# Patient Record
Sex: Male | Born: 1950
Health system: Southern US, Community
[De-identification: ages and names within clinical notes are randomized; demographics above are authoritative.]

## PROBLEM LIST (undated history)

## (undated) DIAGNOSIS — E119 Type 2 diabetes mellitus without complications: Secondary | ICD-10-CM

---

## 2016-02-13 DIAGNOSIS — E119 Type 2 diabetes mellitus without complications: Secondary | ICD-10-CM | POA: Diagnosis not present

## 2016-05-14 DIAGNOSIS — E1165 Type 2 diabetes mellitus with hyperglycemia: Secondary | ICD-10-CM | POA: Diagnosis not present

## 2016-05-14 DIAGNOSIS — Z7984 Long term (current) use of oral hypoglycemic drugs: Secondary | ICD-10-CM | POA: Diagnosis not present

## 2016-08-13 DIAGNOSIS — E119 Type 2 diabetes mellitus without complications: Secondary | ICD-10-CM | POA: Diagnosis not present

## 2016-08-13 DIAGNOSIS — I10 Essential (primary) hypertension: Secondary | ICD-10-CM | POA: Diagnosis not present

## 2016-11-12 DIAGNOSIS — I10 Essential (primary) hypertension: Secondary | ICD-10-CM | POA: Diagnosis not present

## 2016-11-12 DIAGNOSIS — Z6841 Body Mass Index (BMI) 40.0 and over, adult: Secondary | ICD-10-CM | POA: Diagnosis not present

## 2016-11-12 DIAGNOSIS — Z1159 Encounter for screening for other viral diseases: Secondary | ICD-10-CM | POA: Diagnosis not present

## 2016-11-12 DIAGNOSIS — E782 Mixed hyperlipidemia: Secondary | ICD-10-CM | POA: Diagnosis not present

## 2016-11-12 DIAGNOSIS — Z0000 Encounter for general adult medical examination without abnormal findings: Secondary | ICD-10-CM | POA: Diagnosis not present

## 2016-11-12 DIAGNOSIS — E119 Type 2 diabetes mellitus without complications: Secondary | ICD-10-CM | POA: Diagnosis not present

## 2017-05-20 DIAGNOSIS — E119 Type 2 diabetes mellitus without complications: Secondary | ICD-10-CM | POA: Diagnosis not present

## 2017-05-20 DIAGNOSIS — I10 Essential (primary) hypertension: Secondary | ICD-10-CM | POA: Diagnosis not present

## 2017-05-20 DIAGNOSIS — E782 Mixed hyperlipidemia: Secondary | ICD-10-CM | POA: Diagnosis not present

## 2017-05-20 DIAGNOSIS — Z7984 Long term (current) use of oral hypoglycemic drugs: Secondary | ICD-10-CM | POA: Diagnosis not present

## 2017-08-19 DIAGNOSIS — I10 Essential (primary) hypertension: Secondary | ICD-10-CM | POA: Diagnosis not present

## 2017-08-19 DIAGNOSIS — E782 Mixed hyperlipidemia: Secondary | ICD-10-CM | POA: Diagnosis not present

## 2017-08-19 DIAGNOSIS — E871 Hypo-osmolality and hyponatremia: Secondary | ICD-10-CM | POA: Diagnosis not present

## 2017-08-19 DIAGNOSIS — Z2821 Immunization not carried out because of patient refusal: Secondary | ICD-10-CM | POA: Diagnosis not present

## 2017-08-19 DIAGNOSIS — Z6841 Body Mass Index (BMI) 40.0 and over, adult: Secondary | ICD-10-CM | POA: Diagnosis not present

## 2017-08-19 DIAGNOSIS — E119 Type 2 diabetes mellitus without complications: Secondary | ICD-10-CM | POA: Diagnosis not present

## 2017-08-19 DIAGNOSIS — M542 Cervicalgia: Secondary | ICD-10-CM | POA: Diagnosis not present

## 2017-10-21 DIAGNOSIS — J069 Acute upper respiratory infection, unspecified: Secondary | ICD-10-CM | POA: Diagnosis not present

## 2017-10-21 DIAGNOSIS — M545 Low back pain: Secondary | ICD-10-CM | POA: Diagnosis not present

## 2017-11-18 DIAGNOSIS — Z7189 Other specified counseling: Secondary | ICD-10-CM | POA: Diagnosis not present

## 2017-11-18 DIAGNOSIS — Z6841 Body Mass Index (BMI) 40.0 and over, adult: Secondary | ICD-10-CM | POA: Diagnosis not present

## 2017-11-18 DIAGNOSIS — E119 Type 2 diabetes mellitus without complications: Secondary | ICD-10-CM | POA: Diagnosis not present

## 2017-11-18 DIAGNOSIS — E782 Mixed hyperlipidemia: Secondary | ICD-10-CM | POA: Diagnosis not present

## 2017-11-18 DIAGNOSIS — Z0000 Encounter for general adult medical examination without abnormal findings: Secondary | ICD-10-CM | POA: Diagnosis not present

## 2017-11-18 DIAGNOSIS — Z7984 Long term (current) use of oral hypoglycemic drugs: Secondary | ICD-10-CM | POA: Diagnosis not present

## 2017-11-18 DIAGNOSIS — Z1211 Encounter for screening for malignant neoplasm of colon: Secondary | ICD-10-CM | POA: Diagnosis not present

## 2017-11-18 DIAGNOSIS — I10 Essential (primary) hypertension: Secondary | ICD-10-CM | POA: Diagnosis not present

## 2017-11-18 DIAGNOSIS — Z125 Encounter for screening for malignant neoplasm of prostate: Secondary | ICD-10-CM | POA: Diagnosis not present

## 2018-01-20 DIAGNOSIS — Z7984 Long term (current) use of oral hypoglycemic drugs: Secondary | ICD-10-CM | POA: Diagnosis not present

## 2018-01-20 DIAGNOSIS — E119 Type 2 diabetes mellitus without complications: Secondary | ICD-10-CM | POA: Diagnosis not present

## 2018-01-20 DIAGNOSIS — H2513 Age-related nuclear cataract, bilateral: Secondary | ICD-10-CM | POA: Diagnosis not present

## 2018-02-24 DIAGNOSIS — N182 Chronic kidney disease, stage 2 (mild): Secondary | ICD-10-CM | POA: Diagnosis not present

## 2018-02-24 DIAGNOSIS — R809 Proteinuria, unspecified: Secondary | ICD-10-CM | POA: Diagnosis not present

## 2018-02-24 DIAGNOSIS — Z1211 Encounter for screening for malignant neoplasm of colon: Secondary | ICD-10-CM | POA: Diagnosis not present

## 2018-02-24 DIAGNOSIS — Z6841 Body Mass Index (BMI) 40.0 and over, adult: Secondary | ICD-10-CM | POA: Diagnosis not present

## 2018-02-24 DIAGNOSIS — Z7984 Long term (current) use of oral hypoglycemic drugs: Secondary | ICD-10-CM | POA: Diagnosis not present

## 2018-02-24 DIAGNOSIS — E782 Mixed hyperlipidemia: Secondary | ICD-10-CM | POA: Diagnosis not present

## 2018-02-24 DIAGNOSIS — E1129 Type 2 diabetes mellitus with other diabetic kidney complication: Secondary | ICD-10-CM | POA: Diagnosis not present

## 2018-05-26 DIAGNOSIS — E119 Type 2 diabetes mellitus without complications: Secondary | ICD-10-CM | POA: Diagnosis not present

## 2018-05-26 DIAGNOSIS — I10 Essential (primary) hypertension: Secondary | ICD-10-CM | POA: Diagnosis not present

## 2018-05-26 DIAGNOSIS — Z7984 Long term (current) use of oral hypoglycemic drugs: Secondary | ICD-10-CM | POA: Diagnosis not present

## 2018-05-26 DIAGNOSIS — E782 Mixed hyperlipidemia: Secondary | ICD-10-CM | POA: Diagnosis not present

## 2018-09-29 DIAGNOSIS — E119 Type 2 diabetes mellitus without complications: Secondary | ICD-10-CM | POA: Diagnosis not present

## 2018-09-29 DIAGNOSIS — E8881 Metabolic syndrome: Secondary | ICD-10-CM | POA: Diagnosis not present

## 2018-09-29 DIAGNOSIS — N182 Chronic kidney disease, stage 2 (mild): Secondary | ICD-10-CM | POA: Diagnosis not present

## 2018-09-29 DIAGNOSIS — E782 Mixed hyperlipidemia: Secondary | ICD-10-CM | POA: Diagnosis not present

## 2018-09-29 DIAGNOSIS — I10 Essential (primary) hypertension: Secondary | ICD-10-CM | POA: Diagnosis not present

## 2018-11-16 ENCOUNTER — Other Ambulatory Visit: Payer: Self-pay

## 2018-11-16 ENCOUNTER — Emergency Department (HOSPITAL_BASED_OUTPATIENT_CLINIC_OR_DEPARTMENT_OTHER): Payer: PPO

## 2018-11-16 ENCOUNTER — Emergency Department (HOSPITAL_BASED_OUTPATIENT_CLINIC_OR_DEPARTMENT_OTHER)
Admission: EM | Admit: 2018-11-16 | Discharge: 2018-11-16 | Discharge status: Absent without leave | Payer: Self-pay | Attending: Emergency Medicine | Admitting: Emergency Medicine

## 2018-11-16 ENCOUNTER — Emergency Department (HOSPITAL_BASED_OUTPATIENT_CLINIC_OR_DEPARTMENT_OTHER)
Admission: EM | Admit: 2018-11-16 | Discharge: 2018-11-16 | Disposition: A | Discharge status: Home / Self Care | Payer: PPO | Attending: Emergency Medicine | Admitting: Emergency Medicine

## 2018-11-16 ENCOUNTER — Encounter (HOSPITAL_BASED_OUTPATIENT_CLINIC_OR_DEPARTMENT_OTHER): Payer: Self-pay

## 2018-11-16 DIAGNOSIS — R1032 Left lower quadrant pain: Secondary | ICD-10-CM | POA: Diagnosis present

## 2018-11-16 DIAGNOSIS — E119 Type 2 diabetes mellitus without complications: Secondary | ICD-10-CM | POA: Insufficient documentation

## 2018-11-16 DIAGNOSIS — N201 Calculus of ureter: Secondary | ICD-10-CM | POA: Diagnosis not present

## 2018-11-16 DIAGNOSIS — N132 Hydronephrosis with renal and ureteral calculous obstruction: Secondary | ICD-10-CM | POA: Diagnosis not present

## 2018-11-16 HISTORY — DX: Type 2 diabetes mellitus without complications: E11.9

## 2018-11-16 LAB — URINALYSIS, MICROSCOPIC (REFLEX)

## 2018-11-16 LAB — URINALYSIS, ROUTINE W REFLEX MICROSCOPIC
Bilirubin Urine: NEGATIVE
Glucose, UA: >=500 mg/dL — AB
Ketones, ur: NEGATIVE mg/dL
LEUKOCYTE UA: NEGATIVE
Nitrite: NEGATIVE
Protein, ur: 30 mg/dL — AB
Specific Gravity, Urine: 1.015 (ref 1.005–1.030)
pH: 7.5 (ref 5.0–8.0)

## 2018-11-16 MED ORDER — ONDANSETRON 8 MG PO TBDP: 8.0000 mg | ORAL_TABLET | Freq: Three times a day (TID) | ORAL | 1 refills | Status: AC | PRN

## 2018-11-16 MED ORDER — HYDROMORPHONE HCL 1 MG/ML IJ SOLN
1.0000 mg | Freq: Once | INTRAMUSCULAR | Status: AC
  Administered 2018-11-16: 1 mg via INTRAVENOUS
  Filled 2018-11-16: qty 1

## 2018-11-16 MED ORDER — HYDROMORPHONE HCL 2 MG PO TABS: 2.0000 mg | ORAL_TABLET | ORAL | 0 refills | Status: AC | PRN

## 2018-11-16 MED ORDER — ONDANSETRON HCL 4 MG/2ML IJ SOLN
4.0000 mg | Freq: Once | INTRAMUSCULAR | Status: AC
  Administered 2018-11-16: 4 mg via INTRAVENOUS
  Filled 2018-11-16: qty 2

## 2018-11-16 MED ORDER — NAPROXEN 250 MG PO TABS
500.0000 mg | ORAL_TABLET | Freq: Once | ORAL | Status: AC
  Administered 2018-11-16: 500 mg via ORAL
  Filled 2018-11-16: qty 2

## 2018-11-16 NOTE — ED Provider Notes (Signed)
[facility] Provider Note: Lowella Dell, MD, FACEP  CSN: [id number] MRN: [id number] ARRIVAL: 11/16/18 at 0124 ROOM: [facility]10/[facility]10   CHIEF COMPLAINT  Flank Pain   HISTORY OF PRESENT ILLNESS  11/16/18 1:31 AM Tony Mcguire is a 68 y.o. male who is here with a 90-minute history of left lower quadrant pain radiating to his groin.  He has never had this pain before and is having difficulty characterizing it.  He rates it as a 10 out of 10.  It is not worse with movement.  He denies hematuria or dysuria.  He denies nausea or vomiting.  He has no history of kidney stones.   Past Medical History:  Diagnosis Date  . Diabetes mellitus without complication (HCC)     History reviewed. No pertinent surgical history.  No family history on file.  Social History   Tobacco Use  . Smoking status: Not on file  Substance Use Topics  . Alcohol use: Not on file  . Drug use: Not on file    Prior to Admission medications   Medication Sig Start Date End Date Taking? Authorizing Provider  glimepiride (AMARYL) 4 MG tablet  07/19/18   [provider]  metFORMIN (GLUCOPHAGE-XR) 500 MG 24 hr tablet  10/24/18   [provider]  simvastatin (ZOCOR) 20 MG tablet  10/27/18   [provider]  TRULICITY 1.5 MG/0.5ML SOPN  11/01/18   [provider]    Allergies Patient has no known allergies.   REVIEW OF SYSTEMS  Negative except as noted here or in the History of Present Illness.   PHYSICAL EXAMINATION  Initial Vital Signs Blood pressure (!) 167/90, pulse 70, temperature 97.7 F (36.5 C), temperature source Oral, resp. rate 18, height 5\' 2"  (1.575 m), weight 106.1 kg, SpO2 100 %.  Examination General: Well-developed, well-nourished male in no acute distress; appearance consistent with age of record HENT: normocephalic; atraumatic Eyes: pupils equal, round and reactive to light; extraocular muscles intact Neck: supple Heart: regular rate and  rhythm Lungs: clear to auscultation bilaterally Abdomen: soft; obese; nontender; bowel sounds present Extremities: No deformity; full range of motion; pulses normal Neurologic: Awake, alert and oriented; motor function intact in all extremities and symmetric; no facial droop Skin: Warm and dry Psychiatric: Flat affect   RESULTS  Summary of this visit's results, reviewed by myself:   EKG Interpretation  Date/Time:    Ventricular Rate:    PR Interval:    QRS Duration:   QT Interval:    QTC Calculation:   R Axis:     Text Interpretation:        Laboratory Studies: Results for orders placed or performed during the hospital encounter of 11/16/18 (from the past 24 hour(s))  Urinalysis, Routine w reflex microscopic     Status: Abnormal   Collection Time: 11/16/18  2:35 AM  Result Value Ref Range   Color, Urine YELLOW YELLOW   APPearance HAZY (A) CLEAR   Specific Gravity, Urine 1.015 1.005 - 1.030   pH 7.5 5.0 - 8.0   Glucose, UA >=500 (A) NEGATIVE mg/dL   Hgb urine dipstick TRACE (A) NEGATIVE   Bilirubin Urine NEGATIVE NEGATIVE   Ketones, ur NEGATIVE NEGATIVE mg/dL   Protein, ur 30 (A) NEGATIVE mg/dL   Nitrite NEGATIVE NEGATIVE   Leukocytes,Ua NEGATIVE NEGATIVE  Urinalysis, Microscopic (reflex)     Status: Abnormal   Collection Time: 11/16/18  2:35 AM  Result Value Ref Range   RBC / HPF 0-5  0 - 5 RBC/hpf   WBC, UA 0-5 0 - 5 WBC/hpf   Bacteria, UA RARE (A) NONE SEEN   Squamous Epithelial / LPF 0-5 0 - 5   Amorphous Crystal PRESENT    Imaging Studies: Ct Renal Stone Study  Result Date: 11/16/2018 CLINICAL DATA:  Flank pain EXAM: CT ABDOMEN AND PELVIS WITHOUT CONTRAST TECHNIQUE: Multidetector CT imaging of the abdomen and pelvis was performed following the standard protocol without IV contrast. COMPARISON:  None. FINDINGS: Lower chest: Lung bases demonstrate no acute consolidation or pleural effusion. Heart size within normal limits. Mild coronary vascular calcification.  Small hiatal hernia Hepatobiliary: Steatosis. No calcified gallstone or biliary dilatation Pancreas: Unremarkable. No pancreatic ductal dilatation or surrounding inflammatory changes. Spleen: Normal in size without focal abnormality. Adrenals/Urinary Tract: Adrenal glands are normal. Left perinephric fat stranding. Mild left hydronephrosis and hydroureter, secondary to a punctate 2 mm stone in the distal left ureter about 1 cm cephalad to the left UVJ. Bladder otherwise unremarkable. Stomach/Bowel: Stomach is nonenlarged. No dilated small bowel. No colon wall thickening. Sigmoid colon diverticula without acute inflammatory change. Vascular/Lymphatic: Nonaneurysmal aorta. Mild aortic atherosclerosis. No significantly enlarged lymph nodes Reproductive: Prostate is unremarkable. Other: No free air or free fluid. Hazy edema within the mesenteric root Musculoskeletal: No acute or suspicious abnormality. Anterior abdominal wall laxity with bulging intra-abdominal fat, liver and bowel to the right. IMPRESSION: 1. Mild left hydronephrosis and hydroureter, secondary to a 2 mm stone in the distal left ureter about 1-1.5 cm cephalad to the left UVJ. 2. Hepatic steatosis 3. Mild hazy attenuation at the central mesentery which may be secondary to edema or panniculitis. Electronically Signed   By: Jasmine Pang M.D.   On: 11/16/2018 02:18    ED COURSE and MDM  Nursing notes and initial vitals signs, including pulse oximetry, reviewed.  Vitals:   11/16/18 0127  BP: (!) 167/90  Pulse: 70  Resp: 18  Temp: 97.7 F (36.5 C)  TempSrc: Oral  SpO2: 100%  Weight: 106.1 kg  Height: 5\' 2"  (1.575 m)   3:49 AM Pain well controlled at this time.  We will provide him with prescriptions and refer to urology.  PROCEDURES    ED DIAGNOSES     ICD-10-CM   1. Ureterolithiasis N20.1        Chaden Doom, MD 11/16/18 [phone]

## 2018-11-16 NOTE — ED Triage Notes (Signed)
Pt c/o waking up to left side flank pain, denies nausea although smells like vomit, denies difficulty urinating, denies hx of kidney stones

## 2018-11-16 NOTE — ED Notes (Addendum)
Pt in NAD, improvement of pain. Strainer given. Pt instructed to strain urine.

## 2019-03-12 IMAGING — CT CT RENAL STONE PROTOCOL
2 of 4 series · 17 of 46 positions shown, 19 images · non-contrast
Comparison: None.

CLINICAL DATA: Flank pain

EXAM:
CT ABDOMEN AND PELVIS WITHOUT CONTRAST
TECHNIQUE: Multidetector CT imaging of the abdomen and pelvis was performed
following the standard protocol without IV contrast.

[Series 3: axial st · axial · 0.93mm/px · z∈[-445,+30]mm · 14 of 105 slices shown, 16 images]
[im 5/105  soft-tissue]
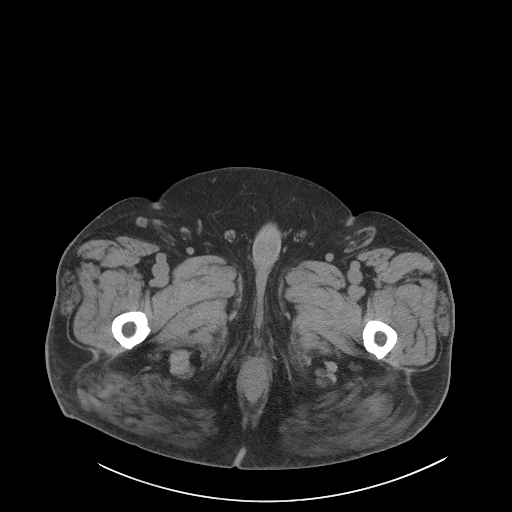
[im 5/105  bone]
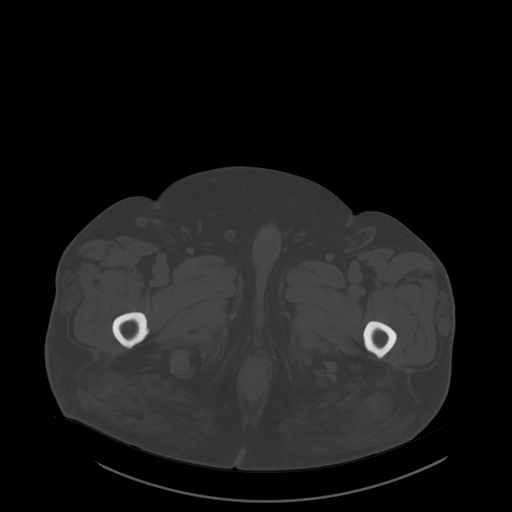
[im 15/105  soft-tissue]
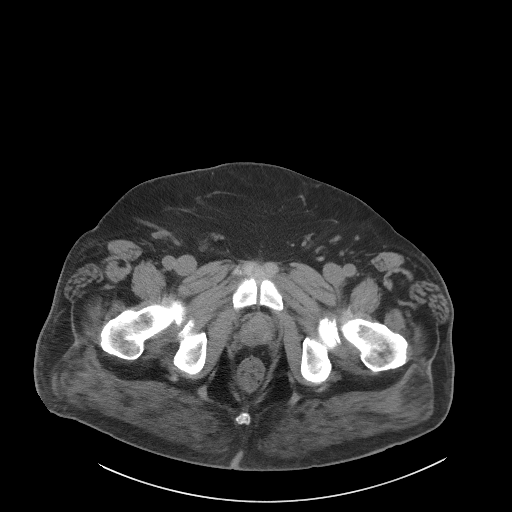
[im 19/105  soft-tissue]
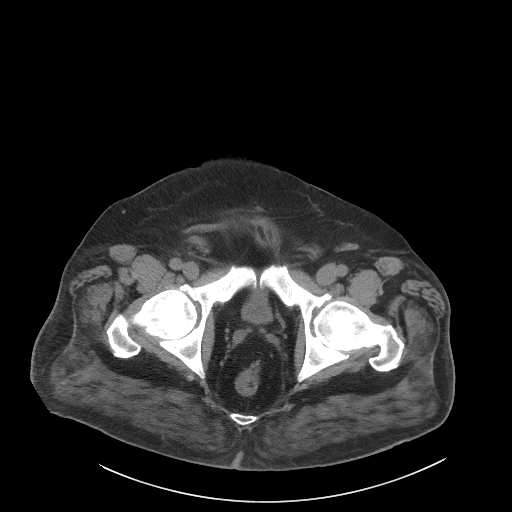
[im 29/105  soft-tissue]
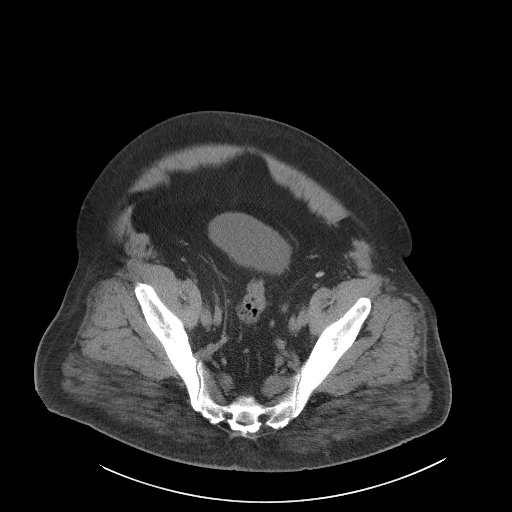
[im 34/105  soft-tissue]
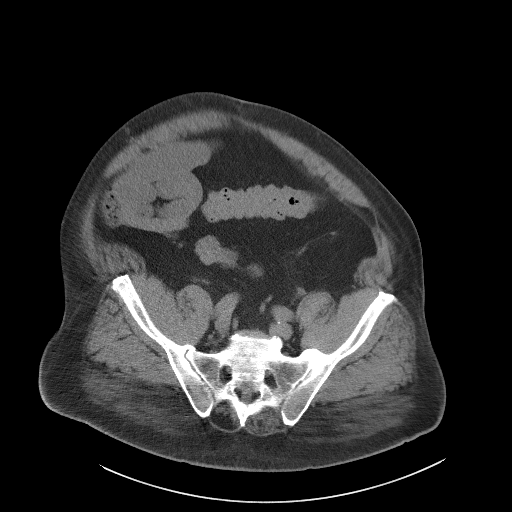
[im 43/105  soft-tissue]
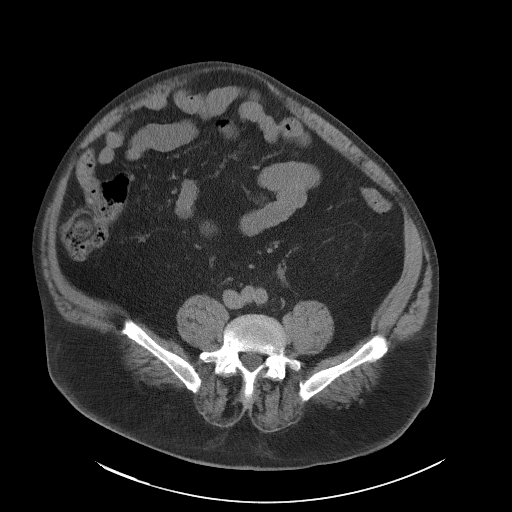
[im 48/105  soft-tissue]
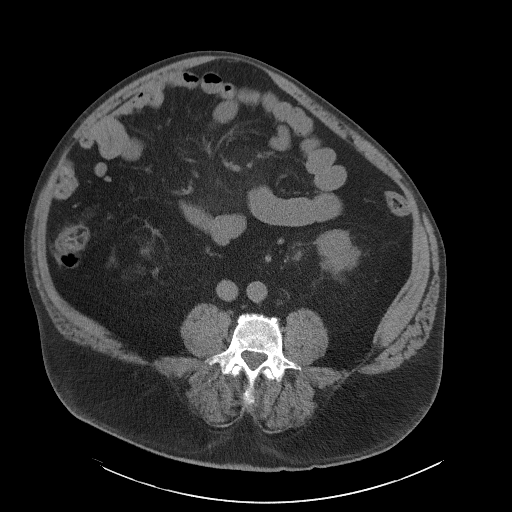
[im 57/105  soft-tissue]
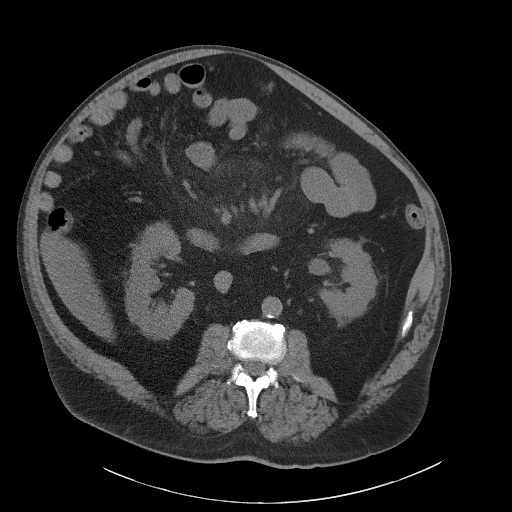
[im 62/105  soft-tissue]
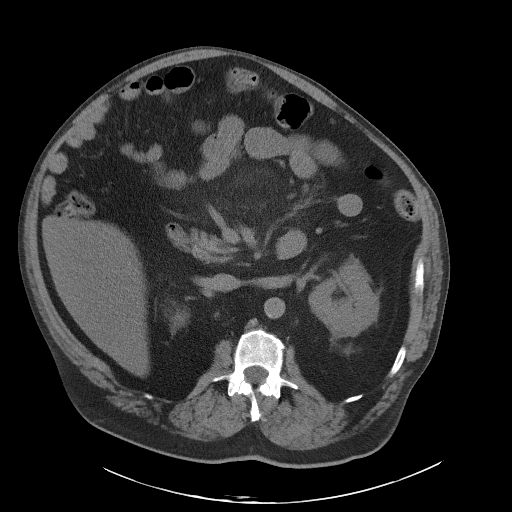
[im 62/105  bone]
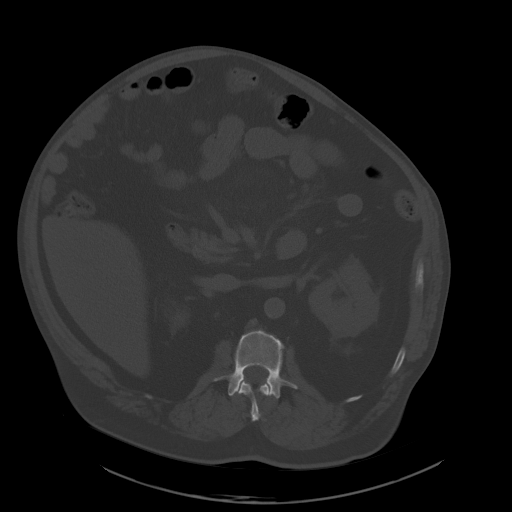
[im 71/105  soft-tissue]
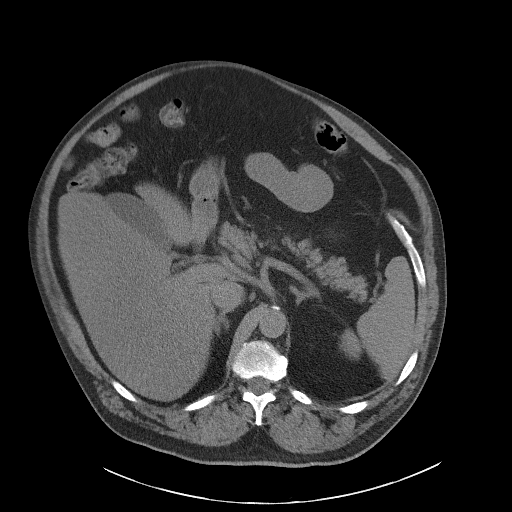
[im 76/105  soft-tissue]
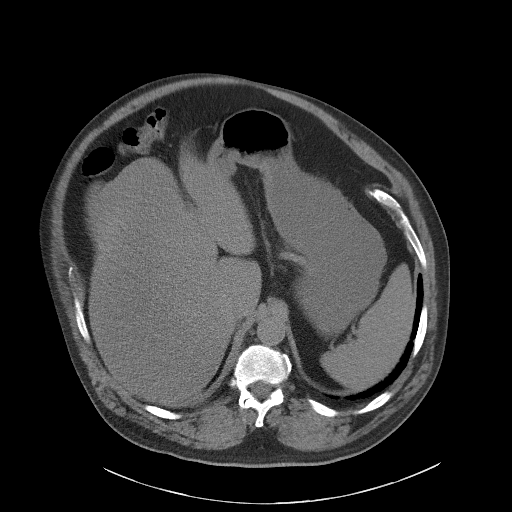
[im 86/105  soft-tissue]
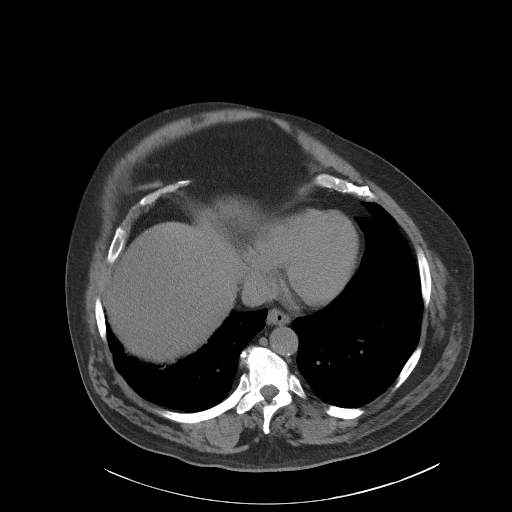
[im 90/105  soft-tissue]
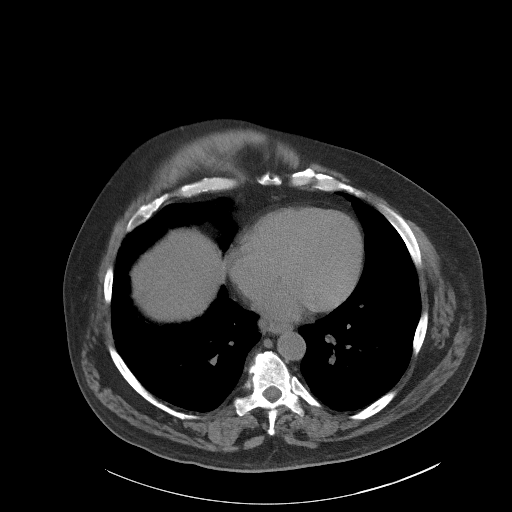
[im 100/105  soft-tissue]
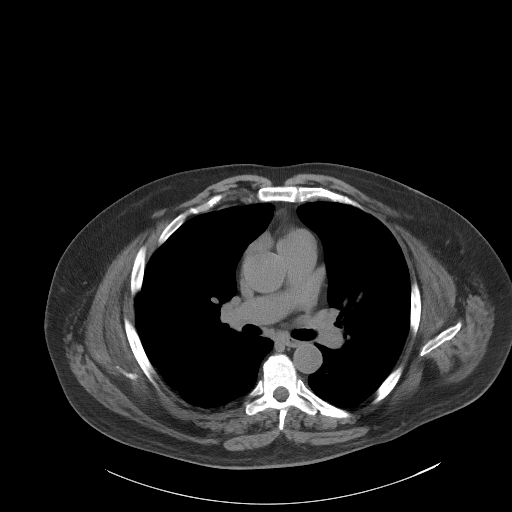

[Series 5: coronal st · coronal · 0.99mm/px · 3 of 134 slices shown]
[im 45/134  soft-tissue]
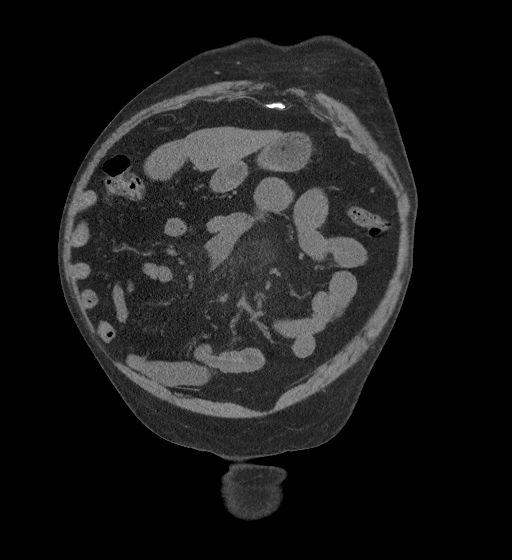
[im 60/134  soft-tissue]
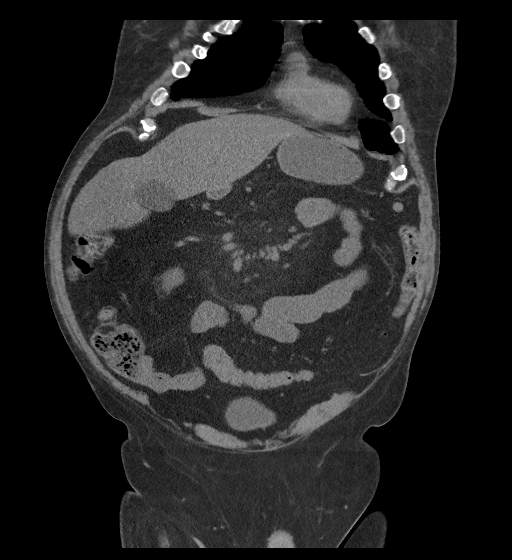
[im 74/134  soft-tissue]
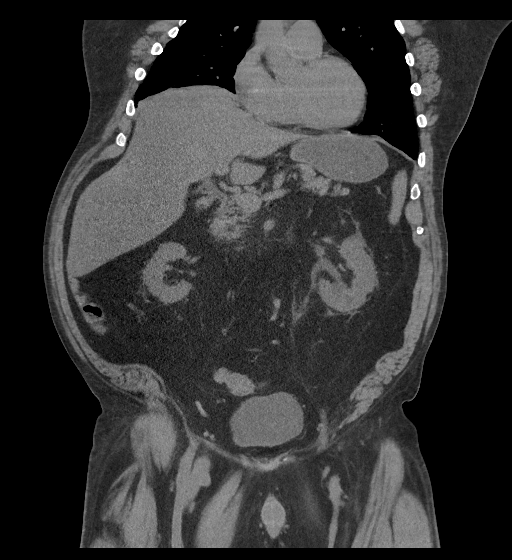

[17 of 46 positions shown; findings below may reference images not displayed]

FINDINGS: Lower chest: Lung bases demonstrate no acute consolidation or
pleural effusion. Heart size within normal limits. Mild coronary
vascular calcification. Small hiatal hernia

Hepatobiliary: Steatosis. No calcified gallstone or biliary
dilatation

Pancreas: Unremarkable. No pancreatic ductal dilatation or
surrounding inflammatory changes.

Spleen: Normal in size without focal abnormality.

Adrenals/Urinary Tract: Adrenal glands are normal. Left perinephric
fat stranding. Mild left hydronephrosis and hydroureter, secondary
to a punctate 2 mm stone in the distal left ureter about 1 cm
cephalad to the left UVJ. Bladder otherwise unremarkable.

Stomach/Bowel: Stomach is nonenlarged. No dilated small bowel. No
colon wall thickening. Sigmoid colon diverticula without acute
inflammatory change.

Vascular/Lymphatic: Nonaneurysmal aorta. Mild aortic
atherosclerosis. No significantly enlarged lymph nodes

Reproductive: Prostate is unremarkable.

Other: No free air or free fluid. Hazy edema within the mesenteric
root

Musculoskeletal: No acute or suspicious abnormality. Anterior
abdominal wall laxity with bulging intra-abdominal fat, liver and
bowel to the right.
IMPRESSION: 1. Mild left hydronephrosis and hydroureter, secondary to a 2 mm
stone in the distal left ureter about 1-1.5 cm cephalad to the left
UVJ.
2. Hepatic steatosis
3. Mild hazy attenuation at the central mesentery which may be
secondary to edema or panniculitis.

## 2019-03-30 DIAGNOSIS — E782 Mixed hyperlipidemia: Secondary | ICD-10-CM | POA: Diagnosis not present

## 2019-03-30 DIAGNOSIS — E8881 Metabolic syndrome: Secondary | ICD-10-CM | POA: Diagnosis not present

## 2019-03-30 DIAGNOSIS — E119 Type 2 diabetes mellitus without complications: Secondary | ICD-10-CM | POA: Diagnosis not present

## 2019-03-30 DIAGNOSIS — I10 Essential (primary) hypertension: Secondary | ICD-10-CM | POA: Diagnosis not present

## 2019-03-30 DIAGNOSIS — N182 Chronic kidney disease, stage 2 (mild): Secondary | ICD-10-CM | POA: Diagnosis not present

## 2019-04-12 DIAGNOSIS — N182 Chronic kidney disease, stage 2 (mild): Secondary | ICD-10-CM | POA: Diagnosis not present

## 2019-04-12 DIAGNOSIS — E782 Mixed hyperlipidemia: Secondary | ICD-10-CM | POA: Diagnosis not present

## 2019-04-12 DIAGNOSIS — I10 Essential (primary) hypertension: Secondary | ICD-10-CM | POA: Diagnosis not present

## 2019-06-02 DIAGNOSIS — Z0000 Encounter for general adult medical examination without abnormal findings: Secondary | ICD-10-CM | POA: Diagnosis not present

## 2019-06-02 DIAGNOSIS — Z1211 Encounter for screening for malignant neoplasm of colon: Secondary | ICD-10-CM | POA: Diagnosis not present

## 2019-06-02 DIAGNOSIS — E8881 Metabolic syndrome: Secondary | ICD-10-CM | POA: Diagnosis not present

## 2019-06-02 DIAGNOSIS — N182 Chronic kidney disease, stage 2 (mild): Secondary | ICD-10-CM | POA: Diagnosis not present

## 2019-06-02 DIAGNOSIS — E782 Mixed hyperlipidemia: Secondary | ICD-10-CM | POA: Diagnosis not present

## 2019-06-02 DIAGNOSIS — E119 Type 2 diabetes mellitus without complications: Secondary | ICD-10-CM | POA: Diagnosis not present

## 2019-06-02 DIAGNOSIS — I10 Essential (primary) hypertension: Secondary | ICD-10-CM | POA: Diagnosis not present

## 2019-06-30 DIAGNOSIS — Z0000 Encounter for general adult medical examination without abnormal findings: Secondary | ICD-10-CM | POA: Diagnosis not present

## 2019-11-17 DIAGNOSIS — E8881 Metabolic syndrome: Secondary | ICD-10-CM | POA: Diagnosis not present

## 2019-11-17 DIAGNOSIS — E119 Type 2 diabetes mellitus without complications: Secondary | ICD-10-CM | POA: Diagnosis not present

## 2019-11-17 DIAGNOSIS — N182 Chronic kidney disease, stage 2 (mild): Secondary | ICD-10-CM | POA: Diagnosis not present

## 2019-11-17 DIAGNOSIS — Z794 Long term (current) use of insulin: Secondary | ICD-10-CM | POA: Diagnosis not present

## 2019-11-17 DIAGNOSIS — I10 Essential (primary) hypertension: Secondary | ICD-10-CM | POA: Diagnosis not present

## 2019-11-17 DIAGNOSIS — Z6841 Body Mass Index (BMI) 40.0 and over, adult: Secondary | ICD-10-CM | POA: Diagnosis not present

## 2019-11-17 DIAGNOSIS — E782 Mixed hyperlipidemia: Secondary | ICD-10-CM | POA: Diagnosis not present

## 2020-07-31 DIAGNOSIS — Z125 Encounter for screening for malignant neoplasm of prostate: Secondary | ICD-10-CM | POA: Diagnosis not present

## 2020-07-31 DIAGNOSIS — Z0000 Encounter for general adult medical examination without abnormal findings: Secondary | ICD-10-CM | POA: Diagnosis not present

## 2020-07-31 DIAGNOSIS — Z136 Encounter for screening for cardiovascular disorders: Secondary | ICD-10-CM | POA: Diagnosis not present

## 2020-07-31 DIAGNOSIS — E1159 Type 2 diabetes mellitus with other circulatory complications: Secondary | ICD-10-CM | POA: Diagnosis not present

## 2020-07-31 DIAGNOSIS — Z7984 Long term (current) use of oral hypoglycemic drugs: Secondary | ICD-10-CM | POA: Diagnosis not present

## 2020-10-31 DIAGNOSIS — Z7984 Long term (current) use of oral hypoglycemic drugs: Secondary | ICD-10-CM | POA: Diagnosis not present

## 2020-10-31 DIAGNOSIS — E1129 Type 2 diabetes mellitus with other diabetic kidney complication: Secondary | ICD-10-CM | POA: Diagnosis not present

## 2020-10-31 DIAGNOSIS — E119 Type 2 diabetes mellitus without complications: Secondary | ICD-10-CM | POA: Diagnosis not present

## 2020-10-31 DIAGNOSIS — I10 Essential (primary) hypertension: Secondary | ICD-10-CM | POA: Diagnosis not present

## 2020-10-31 DIAGNOSIS — E782 Mixed hyperlipidemia: Secondary | ICD-10-CM | POA: Diagnosis not present

## 2021-02-14 DIAGNOSIS — Z7984 Long term (current) use of oral hypoglycemic drugs: Secondary | ICD-10-CM | POA: Diagnosis not present

## 2021-02-14 DIAGNOSIS — E119 Type 2 diabetes mellitus without complications: Secondary | ICD-10-CM | POA: Diagnosis not present

## 2021-08-11 DIAGNOSIS — I10 Essential (primary) hypertension: Secondary | ICD-10-CM | POA: Diagnosis not present

## 2021-08-11 DIAGNOSIS — Z0000 Encounter for general adult medical examination without abnormal findings: Secondary | ICD-10-CM | POA: Diagnosis not present

## 2021-08-11 DIAGNOSIS — Z125 Encounter for screening for malignant neoplasm of prostate: Secondary | ICD-10-CM | POA: Diagnosis not present

## 2021-08-11 DIAGNOSIS — Z7984 Long term (current) use of oral hypoglycemic drugs: Secondary | ICD-10-CM | POA: Diagnosis not present

## 2021-08-11 DIAGNOSIS — Z1211 Encounter for screening for malignant neoplasm of colon: Secondary | ICD-10-CM | POA: Diagnosis not present

## 2021-08-11 DIAGNOSIS — E119 Type 2 diabetes mellitus without complications: Secondary | ICD-10-CM | POA: Diagnosis not present

## 2021-08-11 DIAGNOSIS — E782 Mixed hyperlipidemia: Secondary | ICD-10-CM | POA: Diagnosis not present

## 2021-08-11 DIAGNOSIS — N182 Chronic kidney disease, stage 2 (mild): Secondary | ICD-10-CM | POA: Diagnosis not present

## 2021-09-19 DIAGNOSIS — Z1211 Encounter for screening for malignant neoplasm of colon: Secondary | ICD-10-CM | POA: Diagnosis not present

## 2021-11-18 DIAGNOSIS — E782 Mixed hyperlipidemia: Secondary | ICD-10-CM | POA: Diagnosis not present

## 2021-11-18 DIAGNOSIS — E119 Type 2 diabetes mellitus without complications: Secondary | ICD-10-CM | POA: Diagnosis not present

## 2021-11-18 DIAGNOSIS — I10 Essential (primary) hypertension: Secondary | ICD-10-CM | POA: Diagnosis not present

## 2021-11-20 DIAGNOSIS — Z7984 Long term (current) use of oral hypoglycemic drugs: Secondary | ICD-10-CM | POA: Diagnosis not present

## 2021-11-20 DIAGNOSIS — H2513 Age-related nuclear cataract, bilateral: Secondary | ICD-10-CM | POA: Diagnosis not present

## 2021-11-20 DIAGNOSIS — E119 Type 2 diabetes mellitus without complications: Secondary | ICD-10-CM | POA: Diagnosis not present

## 2021-11-20 DIAGNOSIS — H40011 Open angle with borderline findings, low risk, right eye: Secondary | ICD-10-CM | POA: Diagnosis not present

## 2021-11-25 DIAGNOSIS — R0981 Nasal congestion: Secondary | ICD-10-CM | POA: Diagnosis not present

## 2021-11-25 DIAGNOSIS — J069 Acute upper respiratory infection, unspecified: Secondary | ICD-10-CM | POA: Diagnosis not present

## 2021-11-25 DIAGNOSIS — R519 Headache, unspecified: Secondary | ICD-10-CM | POA: Diagnosis not present

## 2021-11-25 DIAGNOSIS — E119 Type 2 diabetes mellitus without complications: Secondary | ICD-10-CM | POA: Diagnosis not present

## 2021-11-25 DIAGNOSIS — Z20822 Contact with and (suspected) exposure to covid-19: Secondary | ICD-10-CM | POA: Diagnosis not present

## 2022-02-16 DIAGNOSIS — E119 Type 2 diabetes mellitus without complications: Secondary | ICD-10-CM | POA: Diagnosis not present

## 2022-05-19 DIAGNOSIS — I10 Essential (primary) hypertension: Secondary | ICD-10-CM | POA: Diagnosis not present

## 2022-05-19 DIAGNOSIS — E1165 Type 2 diabetes mellitus with hyperglycemia: Secondary | ICD-10-CM | POA: Diagnosis not present

## 2022-05-19 DIAGNOSIS — E782 Mixed hyperlipidemia: Secondary | ICD-10-CM | POA: Diagnosis not present

## 2022-08-25 DIAGNOSIS — E1165 Type 2 diabetes mellitus with hyperglycemia: Secondary | ICD-10-CM | POA: Diagnosis not present

## 2022-08-25 DIAGNOSIS — Z0000 Encounter for general adult medical examination without abnormal findings: Secondary | ICD-10-CM | POA: Diagnosis not present

## 2022-08-25 DIAGNOSIS — N182 Chronic kidney disease, stage 2 (mild): Secondary | ICD-10-CM | POA: Diagnosis not present

## 2022-08-25 DIAGNOSIS — I10 Essential (primary) hypertension: Secondary | ICD-10-CM | POA: Diagnosis not present

## 2022-08-25 DIAGNOSIS — Z7185 Encounter for immunization safety counseling: Secondary | ICD-10-CM | POA: Diagnosis not present

## 2022-08-25 DIAGNOSIS — Z1211 Encounter for screening for malignant neoplasm of colon: Secondary | ICD-10-CM | POA: Diagnosis not present

## 2022-08-25 DIAGNOSIS — E782 Mixed hyperlipidemia: Secondary | ICD-10-CM | POA: Diagnosis not present

## 2022-11-10 DIAGNOSIS — N182 Chronic kidney disease, stage 2 (mild): Secondary | ICD-10-CM | POA: Diagnosis not present

## 2022-11-10 DIAGNOSIS — E1165 Type 2 diabetes mellitus with hyperglycemia: Secondary | ICD-10-CM | POA: Diagnosis not present

## 2022-11-10 DIAGNOSIS — I10 Essential (primary) hypertension: Secondary | ICD-10-CM | POA: Diagnosis not present

## 2022-11-10 DIAGNOSIS — E782 Mixed hyperlipidemia: Secondary | ICD-10-CM | POA: Diagnosis not present

## 2022-11-10 DIAGNOSIS — E1122 Type 2 diabetes mellitus with diabetic chronic kidney disease: Secondary | ICD-10-CM | POA: Diagnosis not present

## 2022-11-10 DIAGNOSIS — Z7984 Long term (current) use of oral hypoglycemic drugs: Secondary | ICD-10-CM | POA: Diagnosis not present

## 2023-05-14 DIAGNOSIS — E1122 Type 2 diabetes mellitus with diabetic chronic kidney disease: Secondary | ICD-10-CM | POA: Diagnosis not present

## 2023-05-14 DIAGNOSIS — N182 Chronic kidney disease, stage 2 (mild): Secondary | ICD-10-CM | POA: Diagnosis not present

## 2023-05-14 DIAGNOSIS — I10 Essential (primary) hypertension: Secondary | ICD-10-CM | POA: Diagnosis not present

## 2023-05-14 DIAGNOSIS — E1165 Type 2 diabetes mellitus with hyperglycemia: Secondary | ICD-10-CM | POA: Diagnosis not present

## 2023-05-14 DIAGNOSIS — E1136 Type 2 diabetes mellitus with diabetic cataract: Secondary | ICD-10-CM | POA: Diagnosis not present

## 2023-05-14 DIAGNOSIS — E782 Mixed hyperlipidemia: Secondary | ICD-10-CM | POA: Diagnosis not present

## 2023-08-18 DIAGNOSIS — Z23 Encounter for immunization: Secondary | ICD-10-CM | POA: Diagnosis not present

## 2023-08-18 DIAGNOSIS — I10 Essential (primary) hypertension: Secondary | ICD-10-CM | POA: Diagnosis not present

## 2023-08-18 DIAGNOSIS — Z0000 Encounter for general adult medical examination without abnormal findings: Secondary | ICD-10-CM | POA: Diagnosis not present

## 2023-08-18 DIAGNOSIS — E785 Hyperlipidemia, unspecified: Secondary | ICD-10-CM | POA: Diagnosis not present

## 2023-08-18 DIAGNOSIS — E1165 Type 2 diabetes mellitus with hyperglycemia: Secondary | ICD-10-CM | POA: Diagnosis not present

## 2023-08-18 DIAGNOSIS — Z1211 Encounter for screening for malignant neoplasm of colon: Secondary | ICD-10-CM | POA: Diagnosis not present

## 2023-09-14 DIAGNOSIS — Z1211 Encounter for screening for malignant neoplasm of colon: Secondary | ICD-10-CM | POA: Diagnosis not present

## 2024-02-17 DIAGNOSIS — E782 Mixed hyperlipidemia: Secondary | ICD-10-CM | POA: Diagnosis not present

## 2024-02-17 DIAGNOSIS — E119 Type 2 diabetes mellitus without complications: Secondary | ICD-10-CM | POA: Diagnosis not present

## 2024-02-17 DIAGNOSIS — I10 Essential (primary) hypertension: Secondary | ICD-10-CM | POA: Diagnosis not present

## 2024-02-17 DIAGNOSIS — Z6837 Body mass index (BMI) 37.0-37.9, adult: Secondary | ICD-10-CM | POA: Diagnosis not present

## 2024-08-10 DIAGNOSIS — Z6838 Body mass index (BMI) 38.0-38.9, adult: Secondary | ICD-10-CM | POA: Diagnosis not present

## 2024-08-10 DIAGNOSIS — N182 Chronic kidney disease, stage 2 (mild): Secondary | ICD-10-CM | POA: Diagnosis not present

## 2024-08-10 DIAGNOSIS — Z125 Encounter for screening for malignant neoplasm of prostate: Secondary | ICD-10-CM | POA: Diagnosis not present

## 2024-08-10 DIAGNOSIS — Z1331 Encounter for screening for depression: Secondary | ICD-10-CM | POA: Diagnosis not present

## 2024-08-10 DIAGNOSIS — E782 Mixed hyperlipidemia: Secondary | ICD-10-CM | POA: Diagnosis not present

## 2024-08-10 DIAGNOSIS — Z0000 Encounter for general adult medical examination without abnormal findings: Secondary | ICD-10-CM | POA: Diagnosis not present

## 2024-08-10 DIAGNOSIS — H9190 Unspecified hearing loss, unspecified ear: Secondary | ICD-10-CM | POA: Diagnosis not present

## 2024-08-10 DIAGNOSIS — E119 Type 2 diabetes mellitus without complications: Secondary | ICD-10-CM | POA: Diagnosis not present
